# Patient Record
Sex: Female | Born: 1960 | Race: White | Hispanic: No | Marital: Married | Smoking: Never smoker
Health system: Southern US, Community
[De-identification: ages and names within clinical notes are randomized; demographics above are authoritative.]

## PROBLEM LIST (undated history)

## (undated) DIAGNOSIS — N390 Urinary tract infection, site not specified: Secondary | ICD-10-CM

## (undated) DIAGNOSIS — Z8742 Personal history of other diseases of the female genital tract: Secondary | ICD-10-CM

## (undated) HISTORY — DX: Urinary tract infection, site not specified: N39.0

## (undated) HISTORY — PX: NO PAST SURGERIES: SHX2092

---

## 1998-04-04 ENCOUNTER — Inpatient Hospital Stay (HOSPITAL_COMMUNITY): Admission: AD | Admit: 1998-04-04 | Discharge: 1998-04-04 | Payer: Self-pay | Admitting: Obstetrics and Gynecology

## 1998-06-08 ENCOUNTER — Inpatient Hospital Stay (HOSPITAL_COMMUNITY): Admission: AD | Admit: 1998-06-08 | Discharge: 1998-06-10 | Payer: Self-pay | Admitting: Obstetrics and Gynecology

## 1998-06-15 ENCOUNTER — Encounter (HOSPITAL_COMMUNITY): Admission: RE | Admit: 1998-06-15 | Discharge: 1998-09-13 | Payer: Self-pay

## 1998-07-04 ENCOUNTER — Other Ambulatory Visit: Admission: RE | Admit: 1998-07-04 | Discharge: 1998-07-04 | Payer: Self-pay | Admitting: Obstetrics and Gynecology

## 1998-09-22 ENCOUNTER — Encounter (HOSPITAL_COMMUNITY): Admission: RE | Admit: 1998-09-22 | Discharge: 1998-11-11 | Payer: Self-pay | Admitting: *Deleted

## 1999-05-10 ENCOUNTER — Other Ambulatory Visit: Admission: RE | Admit: 1999-05-10 | Discharge: 1999-05-10 | Payer: Self-pay | Admitting: Obstetrics and Gynecology

## 1999-12-13 ENCOUNTER — Inpatient Hospital Stay (HOSPITAL_COMMUNITY): Admission: AD | Admit: 1999-12-13 | Discharge: 1999-12-15 | Payer: Self-pay | Admitting: Obstetrics and Gynecology

## 2000-01-26 ENCOUNTER — Other Ambulatory Visit: Admission: RE | Admit: 2000-01-26 | Discharge: 2000-01-26 | Payer: Self-pay | Admitting: Obstetrics and Gynecology

## 2001-02-05 ENCOUNTER — Other Ambulatory Visit: Admission: RE | Admit: 2001-02-05 | Discharge: 2001-02-05 | Payer: Self-pay | Admitting: Obstetrics and Gynecology

## 2001-05-23 ENCOUNTER — Encounter: Admission: RE | Admit: 2001-05-23 | Discharge: 2001-05-23 | Payer: Self-pay | Admitting: Family Medicine

## 2001-05-23 ENCOUNTER — Encounter: Payer: Self-pay | Admitting: Family Medicine

## 2002-04-21 ENCOUNTER — Other Ambulatory Visit: Admission: RE | Admit: 2002-04-21 | Discharge: 2002-04-21 | Payer: Self-pay | Admitting: Obstetrics and Gynecology

## 2002-08-11 ENCOUNTER — Ambulatory Visit (HOSPITAL_COMMUNITY): Admission: RE | Admit: 2002-08-11 | Discharge: 2002-08-11 | Payer: Self-pay | Admitting: Obstetrics and Gynecology

## 2002-08-11 ENCOUNTER — Encounter (INDEPENDENT_AMBULATORY_CARE_PROVIDER_SITE_OTHER): Payer: Self-pay | Admitting: *Deleted

## 2003-08-31 ENCOUNTER — Other Ambulatory Visit: Admission: RE | Admit: 2003-08-31 | Discharge: 2003-08-31 | Payer: Self-pay | Admitting: Obstetrics and Gynecology

## 2004-09-22 ENCOUNTER — Other Ambulatory Visit: Admission: RE | Admit: 2004-09-22 | Discharge: 2004-09-22 | Payer: Self-pay | Admitting: Obstetrics and Gynecology

## 2005-08-14 ENCOUNTER — Inpatient Hospital Stay (HOSPITAL_COMMUNITY): Admission: RE | Admit: 2005-08-14 | Discharge: 2005-08-16 | Payer: Self-pay | Admitting: Obstetrics and Gynecology

## 2005-08-17 ENCOUNTER — Inpatient Hospital Stay (HOSPITAL_COMMUNITY): Admission: AD | Admit: 2005-08-17 | Discharge: 2005-08-19 | Payer: Self-pay | Admitting: Obstetrics and Gynecology

## 2005-09-26 ENCOUNTER — Other Ambulatory Visit: Admission: RE | Admit: 2005-09-26 | Discharge: 2005-09-26 | Payer: Self-pay | Admitting: Obstetrics and Gynecology

## 2008-02-26 ENCOUNTER — Ambulatory Visit (HOSPITAL_COMMUNITY): Admission: RE | Admit: 2008-02-26 | Discharge: 2008-02-26 | Payer: Self-pay | Admitting: Obstetrics and Gynecology

## 2008-02-26 ENCOUNTER — Encounter (INDEPENDENT_AMBULATORY_CARE_PROVIDER_SITE_OTHER): Payer: Self-pay | Admitting: Obstetrics and Gynecology

## 2011-04-20 NOTE — Discharge Summary (Signed)
Dana Weaver, Dana Weaver                ACCOUNT NO.:  [id number]   MEDICAL RECORD NO.:  [id number]          PATIENT TYPE:  INP   LOCATION:  9154                          FACILITY:  [facility]   PHYSICIAN:  Michelle L. Grewal, M.D.DATE OF BIRTH:  06-Mar-1961   DATE OF ADMISSION:  08/17/2005  DATE OF DISCHARGE:  08/19/2005                                 DISCHARGE SUMMARY   ADMISSION DIAGNOSIS:  Postpartum preeclampsia.   DISCHARGE DIAGNOSIS:  Postpartum preeclampsia.   HOSPITAL COURSE:  The patient is a 50 year old female status post delivery 6  days ago.  She presented with headache and lower back pain.  Blood pressure  was noted to be 170s over 80s to 90s.  She was noted to have 1+ edema on  exam.  Of note, her LFTs were slightly elevated at 69, AST and ALT were 77  and platelet count was 215.  The patient was admitted with a diagnosis of  postpartum PIH and given magnesium and labetalol.  She diuresed tremendously  and approximately 6.5 liters throughout her hospitalization.  She did  develop some mild pulmonary edema and was given Lasix on hospital day #2 and  had a great diuresis and by hospital day #3, was feeling much better.  She  had no shortness of breath.  Her blood pressure was down.  Platelet count on  that day was 253, AST was 56, and ALT was 85.  The magnesium was  discontinued.  She was discharged home on labetalol 100 mg p.o. twice daily.  She was advised about headache, nausea or right upper quadrant pain or  increase in edema.  She will follow up next week for a blood pressure check.      Michelle L. Vincente Poli, M.D.  Electronically Signed     MLG/MEDQ  D:  10/11/2005  T:  10/11/2005  Job:  [id number]

## 2011-04-20 NOTE — Op Note (Signed)
   NAME:  Dana Weaver, Dana Weaver                          ACCOUNT NO.:  [id number]   MEDICAL RECORD NO.:  [id number]                   PATIENT TYPE:  AMB   LOCATION:  SDC                                  FACILITY:  [facility]   PHYSICIAN:  Juluis Mire, M.D.                DATE OF BIRTH:  [birth year]   DATE OF PROCEDURE:  08/10/2002  DATE OF DISCHARGE:  08/11/2002                                 OPERATIVE REPORT   PREOPERATIVE DIAGNOSES:  Abnormal uterine bleeding with known endometrial  polyp.   POSTOPERATIVE DIAGNOSES:  Abnormal uterine bleeding with known endometrial  polyp.   OPERATIVE PROCEDURE:  Paracervical block, cervical dilatation, hysteroscopy  with resection of endometrial polyp, and multiple endometrial biopsies.   SURGEON:  Juluis Mire, M.D.   ANESTHESIA:  Sedation with paracervical block.   ESTIMATED BLOOD LOSS:  Minimal.   PACKS AND DRAINS:  None.   INTRAOPERATIVE BLOOD PLACED:  None.   COMPLICATIONS:  None.   INDICATIONS:  Dictated in history and physical.   PROCEDURE:  The patient taken to the OR and placed in the supine position.  After sedation the patient was placed in the dorsal lithotomy position using  the Allen stirrups.  The patient was draped out for hysteroscopy.  Speculum  was placed in the vaginal vault.  The cervix and vagina cleansed with  Betadine.  The paracervical block was instituted using 1% Xylocaine.  The  cervix was grasped with single tooth tenaculum.  Uterus sounded  approximately 8 cm.  Cervix serially dilated to a size 35 Pratt dilator.  Operative hysteroscope was introduced and intrauterine cavity was distended  using sorbitol.  Apparent multiple polyps were noted and resected.  These  were all sent for pathologic review.  Subsequent multiple endometrial  biopsies were obtained and sent for pathologic review.  There was no  evidence of uterine perforation or complications.  No active bleeding was  noted.  Hysteroscope and single tooth  tenaculum were removed.  Again, no  active bleeding was noted.  Speculum was then removed.  Sponge, instrument,  needle count reported correct by circulating nurse.  The patient did  tolerate procedure well and once alert transferred to recovery room in good  condition.                                               Juluis Mire, M.D.    JSM/MEDQ  D:  08/14/2002  T:  08/14/2002  Job:  [id number]

## 2011-04-20 NOTE — H&P (Signed)
NAME:  Dana Weaver, FOURNIER NO.:  [id number]   MEDICAL RECORD NO.:  [id number]                   PATIENT TYPE:   LOCATION:                                       FACILITY:   PHYSICIAN:  Juluis Mire, M.D.                DATE OF BIRTH:   DATE OF ADMISSION:  DATE OF DISCHARGE:                                HISTORY & PHYSICAL   REASON FOR ADMISSION:  The patient is a 50 year old gravida 4, para 4,  married white female who presents for hysteroscopy.   HISTORY OF PRESENT ILLNESS:  In relation to the present illness, the patient  has noted that her cycles are varying between four to six weeks. They have  become extremely irregular in flow and have increased slightly, although  have not been a significant issue. Because of irregular bleeding, she  underwent a saline infusion ultrasound. On saline infusion ultrasound there  was a significant endometrial polyp extending from the anterior wall. In  view of this finding, the patient now presents for hysteroscopic evaluation  and removed of observed polyp.   ALLERGIES:  No know drug allergies.   CURRENT MEDICATIONS:  Minocin.   PAST MEDICAL HISTORY:  Usual childhood diseases without any significant  sequelae. She has been hospitalized previously with a broken ankle.   PAST SURGICAL HISTORY:  None.   OBSTETRICAL/GYNECOLOGIC HISTORY:  She has had four spontaneous vaginal  deliveries.   FAMILY HISTORY:  Noncontributory.   SOCIAL HISTORY:  Reveals no tobacco or alcohol use.   REVIEW OF SYMPTOMS:  Noncontributory.   PHYSICAL EXAMINATION:  VITAL SIGNS:  The patient is afebrile with stable  vital signs.  HEENT:  The patient is normocephalic. Pupils equal, round and reactive to  light and accommodation. Extraocular movements were intact. Sclerae and  conjunctivae are clear. Oropharynx clear. Neck without thyromegaly,  lymphadenopathy or masses.  LUNGS:  Clear.  CARDIAC:  Regular rhythm and rate without  murmurs, rubs or gallops.  ABDOMEN:  Abdominal exam is benign, no mass, organomegaly or tenderness.  PELVIC:  On pelvic exam, normal external genitalia. The vaginal mucosa is  clear. Cervix is unremarkable. Uterus of normal size, shape and contour.  Adnexa are free of masses or tenderness.  EXTREMITIES:  Trace edema.  NEUROLOGIC:  Exam is grossly within normal limits.   IMPRESSION:  Endometrial polyp.   PLAN:  The patient will undergo hysteroscopic removal of endometrial polyp.  The risks have been discussed including the risk of infection, the risk of  vascular injury that could require hysterectomy or transfusion, the risk of  injury to adjacent organs through perforation that could require exploratory  surgery, the risks of deep venous thrombosis and pulmonary embolus. The  patient expressed understanding of the indications and risks.  Juluis Mire, M.D.    JSM/MEDQ  D:  08/11/2002  T:  08/11/2002  Job:  [id number]

## 2012-02-19 ENCOUNTER — Other Ambulatory Visit: Payer: Self-pay | Admitting: Obstetrics and Gynecology

## 2012-02-19 DIAGNOSIS — R928 Other abnormal and inconclusive findings on diagnostic imaging of breast: Secondary | ICD-10-CM

## 2012-02-28 ENCOUNTER — Ambulatory Visit
Admission: RE | Admit: 2012-02-28 | Discharge: 2012-02-28 | Disposition: A | Discharge status: Home / Self Care | Payer: PRIVATE HEALTH INSURANCE | Source: Ambulatory Visit | Attending: Obstetrics and Gynecology | Admitting: Obstetrics and Gynecology

## 2012-02-28 DIAGNOSIS — R928 Other abnormal and inconclusive findings on diagnostic imaging of breast: Secondary | ICD-10-CM

## 2012-12-19 ENCOUNTER — Other Ambulatory Visit: Payer: Self-pay | Admitting: Obstetrics and Gynecology

## 2012-12-19 DIAGNOSIS — N6310 Unspecified lump in the right breast, unspecified quadrant: Secondary | ICD-10-CM

## 2012-12-30 ENCOUNTER — Ambulatory Visit
Admission: RE | Admit: 2012-12-30 | Discharge: 2012-12-30 | Disposition: A | Discharge status: Home / Self Care | Payer: BC Managed Care – PPO | Source: Ambulatory Visit | Attending: Obstetrics and Gynecology | Admitting: Obstetrics and Gynecology

## 2012-12-30 DIAGNOSIS — N6310 Unspecified lump in the right breast, unspecified quadrant: Secondary | ICD-10-CM

## 2015-03-29 ENCOUNTER — Other Ambulatory Visit: Payer: Self-pay | Admitting: Obstetrics and Gynecology

## 2015-03-30 LAB — CYTOLOGY - PAP

## 2016-12-21 ENCOUNTER — Emergency Department (HOSPITAL_BASED_OUTPATIENT_CLINIC_OR_DEPARTMENT_OTHER): Payer: PRIVATE HEALTH INSURANCE

## 2016-12-21 ENCOUNTER — Emergency Department (HOSPITAL_BASED_OUTPATIENT_CLINIC_OR_DEPARTMENT_OTHER)
Admission: EM | Admit: 2016-12-21 | Discharge: 2016-12-21 | Disposition: A | Discharge status: Home / Self Care | Payer: PRIVATE HEALTH INSURANCE | Attending: Emergency Medicine | Admitting: Emergency Medicine

## 2016-12-21 ENCOUNTER — Encounter (HOSPITAL_BASED_OUTPATIENT_CLINIC_OR_DEPARTMENT_OTHER): Payer: Self-pay | Admitting: Emergency Medicine

## 2016-12-21 DIAGNOSIS — Z79899 Other long term (current) drug therapy: Secondary | ICD-10-CM | POA: Diagnosis not present

## 2016-12-21 DIAGNOSIS — M79605 Pain in left leg: Secondary | ICD-10-CM

## 2016-12-21 DIAGNOSIS — M25562 Pain in left knee: Secondary | ICD-10-CM | POA: Diagnosis present

## 2016-12-21 HISTORY — DX: Personal history of other diseases of the female genital tract: Z87.42

## 2016-12-21 LAB — CBC WITH DIFFERENTIAL/PLATELET
Basophils Absolute: 0 10*3/uL (ref 0.0–0.1)
Basophils Relative: 0 %
Eosinophils Absolute: 0.1 10*3/uL (ref 0.0–0.7)
Eosinophils Relative: 3 %
HCT: 32.4 % — ABNORMAL LOW (ref 36.0–46.0)
Hemoglobin: 9.9 g/dL — ABNORMAL LOW (ref 12.0–15.0)
Lymphocytes Relative: 20 %
Lymphs Abs: 1.1 10*3/uL (ref 0.7–4.0)
MCH: 25.7 pg — ABNORMAL LOW (ref 26.0–34.0)
MCHC: 30.6 g/dL (ref 30.0–36.0)
MCV: 84.2 fL (ref 78.0–100.0)
Monocytes Absolute: 0.5 10*3/uL (ref 0.1–1.0)
Monocytes Relative: 9 %
Neutro Abs: 3.8 10*3/uL (ref 1.7–7.7)
Neutrophils Relative %: 69 %
Platelets: 344 10*3/uL (ref 150–400)
RBC: 3.85 MIL/uL — ABNORMAL LOW (ref 3.87–5.11)
WBC: 5.6 10*3/uL (ref 4.0–10.5)

## 2016-12-21 NOTE — ED Triage Notes (Signed)
Pt placed on birth control due to heavy vaginal bleeding.  Bleeding is controlled but now pt having intermittent pain to left ankle.  Now having left knee and left thigh pain.  Pt concerned about DVT.  No sob today.  Did have sob one week ago but subsided.

## 2016-12-21 NOTE — Discharge Instructions (Signed)
Please read attached information. If you experience any new or worsening signs or symptoms please return to the emergency room for evaluation. Please follow-up with your primary care provider or specialist as discussed. Please use medication prescribed only as directed and discontinue taking if you have any concerning signs or symptoms.   °

## 2016-12-21 NOTE — ED Provider Notes (Signed)
[facility] Provider Note   CSN: [id number] Arrival date & time: 12/21/16  [id number]   History   Chief Complaint Chief Complaint  Patient presents with  . Leg Pain  . Knee Pain    HPI [name] is a 56 y.o. female.  HPI   56 year old female presents today with complaints of left leg and knee pain. Patient reports yesterday she started developing pain behind her left hamstring and posterior knee. She reports that getting in and out of her vehicle causes pain, she denies any pain to palpation, lower extremity swelling or edema, warmth or knee pain. Patient reports that she was shoveling snow yesterday and questions if this may have some part in her discomfort. Patient concerned as she recently started taking birth control for heavy vaginal bleeding.      Past Medical History:  Diagnosis Date  . History of heavy vaginal bleeding     There are no active problems to display for this patient.   No past surgical history on file.  OB History    No data available       Home Medications    Prior to Admission medications   Medication Sig Start Date End Date Taking? Authorizing Provider  ferrous sulfate 325 (65 FE) MG EC tablet Take 325 mg by mouth 2 (two) times daily.   Yes Historical Provider, MD  UNKNOWN TO PATIENT    Yes Historical Provider, MD    Family History No family history on file.  Social History Social History  Substance Use Topics  . Smoking status: Never Smoker  . Smokeless tobacco: Never Used  . Alcohol use Not on file     Allergies   Patient has no known allergies.   Review of Systems Review of Systems  All other systems reviewed and are negative.  Physical Exam Updated Vital Signs BP 130/80   Pulse 98   Temp 98.5 F (36.9 C) (Oral)   Resp 16   Ht 5\' 4"  (1.626 m)   Wt 68 kg   LMP 12/01/2016   SpO2 99%   BMI 25.75 kg/m   Physical Exam  Constitutional: She is oriented to person, place, and time. She appears  well-developed and well-nourished.  HENT:  Head: Normocephalic and atraumatic.  Eyes: Conjunctivae are normal. Pupils are equal, round, and reactive to light. Right eye exhibits no discharge. Left eye exhibits no discharge. No scleral icterus.  Neck: Normal range of motion. No JVD present. No tracheal deviation present.  Pulmonary/Chest: Effort normal. No stridor.  Musculoskeletal:  Lower extremities equal bilateral no swelling or edema. No significant tenderness, no warmth to touch. Pedal pulses 2+.  Neurological: She is alert and oriented to person, place, and time. Coordination normal.  Psychiatric: She has a normal mood and affect. Her behavior is normal. Judgment and thought content normal.  Nursing note and vitals reviewed.    ED Treatments / Results  Labs (all labs ordered are listed, but only abnormal results are displayed) Labs Reviewed  CBC WITH DIFFERENTIAL/PLATELET - Abnormal; Notable for the following:       Result Value   RBC 3.85 (*)    Hemoglobin 9.9 (*)    HCT 32.4 (*)    MCH 25.7 (*)    All other components within normal limits    EKG  EKG Interpretation None       Radiology [place] Venous Img Lower Unilateral Left  Result Date: 12/21/2016 CLINICAL DATA:  Left knee pain. EXAM: LEFT LOWER  EXTREMITY VENOUS DOPPLER ULTRASOUND TECHNIQUE: Gray-scale sonography with graded compression, as well as color Doppler and duplex ultrasound were performed to evaluate the lower extremity deep venous systems from the level of the common femoral vein and including the common femoral, femoral, profunda femoral, popliteal and calf veins including the posterior tibial, peroneal and gastrocnemius veins when visible. The superficial great saphenous vein was also interrogated. Spectral Doppler was utilized to evaluate flow at rest and with distal augmentation maneuvers in the common femoral, femoral and popliteal veins. COMPARISON:  None. FINDINGS: Contralateral Common Femoral Vein:  Respiratory phasicity is normal and symmetric with the symptomatic side. No evidence of thrombus. Normal compressibility. Common Femoral Vein: No evidence of thrombus. Normal compressibility, respiratory phasicity and response to augmentation. Saphenofemoral Junction: No evidence of thrombus. Normal compressibility and flow on color Doppler imaging. Profunda Femoral Vein: No evidence of thrombus. Normal compressibility and flow on color Doppler imaging. Femoral Vein: No evidence of thrombus. Normal compressibility, respiratory phasicity and response to augmentation. Popliteal Vein: No evidence of thrombus. Normal compressibility, respiratory phasicity and response to augmentation. Calf Veins: No evidence of thrombus. Normal compressibility and flow on color Doppler imaging. Superficial Great Saphenous Vein: No evidence of thrombus. Normal compressibility and flow on color Doppler imaging. Venous Reflux:  None. Other Findings: No evidence of superficial thrombophlebitis or abnormal fluid collection. IMPRESSION: No evidence of left lower extremity deep venous thrombosis. Electronically Signed   By: Irish Lack M.D.   On: 12/21/2016 10:38    Procedures Procedures (including critical care time)  Medications Ordered in ED Medications - No data to display   Initial Impression / Assessment and Plan / ED Course  I have reviewed the triage vital signs and the nursing notes.  Pertinent labs & imaging results that were available during my care of the patient were reviewed by me and considered in my medical decision making (see chart for details).     Labs:  Imaging:  Consults:  Therapeutics:  Discharge Meds:   Assessment/Plan:  56 year old female presents today with leg pain. Patient's presentation is most consistent with muscular pain. She was shoveling snow yesterday. She has no signs of DVT on exam. Patient will be discharged home with symptomatic care instructions, she was given strict return  precautions. She verbalized understanding and agreement to today's plan had no further questions or concerns at time of discharge    Final Clinical Impressions(s) / ED Diagnoses   Final diagnoses:  Pain of left lower extremity    New Prescriptions Discharge Medication List as of 12/21/2016 11:12 AM       Eyvonne Mechanic, PA-C 12/21/16 1545    Geoffery Lyons, MD 12/21/16 1600

## 2017-04-22 ENCOUNTER — Encounter (INDEPENDENT_AMBULATORY_CARE_PROVIDER_SITE_OTHER): Payer: Self-pay

## 2017-04-22 ENCOUNTER — Ambulatory Visit (INDEPENDENT_AMBULATORY_CARE_PROVIDER_SITE_OTHER): Payer: PRIVATE HEALTH INSURANCE | Admitting: Neurology

## 2017-04-22 ENCOUNTER — Encounter: Payer: Self-pay | Admitting: Neurology

## 2017-04-22 VITALS — BP 116/77 | HR 88 | Ht 64.0 in | Wt 154.4 lb

## 2017-04-22 DIAGNOSIS — R253 Fasciculation: Secondary | ICD-10-CM

## 2017-04-22 NOTE — Progress Notes (Signed)
[facility]    Provider:  Dr Jaynee Eagles Referring Provider: Ronald Lobo MD Primary Care Physician:  Ronald Lobo MD  CC:  Fasciculations  HPI:  Dana Weaver is a 56 y.o. female here as a referral from Dr. Laurann Montana for fasciculations. Started 1-2 years ago. She would feel them at night in her legs or in her thigh or back or arms, really anywhere. She weight lifts, she thought it was the exercise Mostly at night. Worsening the last 3-4 months and happens more during the day, anywhere on the torso, extremities and even on the face occ. A muscle twitch is brief, can be multiple in a row. No Fhx of neuromuscular disease. No weakness. She is still lifting weights.No rashes or joint pain. She has tingling in her toes or pin prick feelings rarely maybe once a week and is brief. No consistent sensory changes or numbness. Strength is fine. No weight loss or muscle wasting. No difficulty with speech, no dysarthria, dysphagia, facial weakness. Not painful but she is worried because of the dramatic increase. She has increased stress recently that coincides with the increase of fasciculations. Her son is running for political office and this is stressful for her. She takes Vitamin D daily due to deficiency. She also takes a multivitamin.  No other OTC meds, she takes motrin sometimes and no benadryl or anti-histamines or anti-cholinergic. No inhalers.   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 No associated loss of strength or coordination. No focal neurologic deficits. Completely non-incapacitating and likely benign but need to rule out other etiologies.   Review of Systems: Patient complains of symptoms per HPI as well as the following symptoms: twitching, no CP, no SOB, +stress. Pertinent negatives per HPI. All others negative.   Social History    Social History  . Marital status: Married    Spouse name: N/A  . Number of children: 5  . Years of education: BA (accounting)   Occupational History  . [employer]    Social History Main Topics  . Smoking status: Never Smoker  . Smokeless tobacco: Never Used  . Alcohol use 1.8 oz/week    3 Standard drinks or equivalent per week  . Drug use: No  . Sexual activity: Not on file   Other Topics Concern  . Not on file   Social History Narrative   Lives at home w/ her husband and 3 children   Right-handed   Caffeine: 3 cups per day    Family History  Problem Relation Age of Onset  . Heart disease Mother   . Stroke Mother   . Alzheimer's disease Mother   . Breast cancer Mother   . AAA (abdominal aortic aneurysm) Father   . High blood pressure Father   . Colon cancer Paternal Uncle   . Neuromuscular disorder Neg Hx     Past Medical History:  Diagnosis Date  . History of heavy vaginal bleeding   . UTI (urinary tract infection)     Past Surgical History:  Procedure Laterality Date  . NO PAST SURGERIES      Current Outpatient Prescriptions  Medication Sig Dispense Refill  . cholecalciferol (VITAMIN D) 1000 units tablet Take 1,000 Units by mouth daily.    . Multiple Vitamin (MULTIVITAMIN) tablet Take 1 tablet by mouth daily.     No current facility-administered medications for this  visit.     Allergies as of 04/22/2017  . (No Known Allergies)    Vitals: BP 116/77   Pulse 88   Ht _0  (1.626 m)   Wt 154 lb 6.4 oz (70 kg)   BMI 26.50 kg/m  Last Weight:  Wt Readings from Last 1 Encounters:  04/22/17 154 lb 6.4 oz (70 kg)   Last Height:   Ht Readings from Last 1 Encounters:  04/22/17 _1  (1.626 m)   Physical exam: Exam: Gen: NAD, conversant, well nourised, well groomed                     CV: RRR, no MRG. No Carotid Bruits. No peripheral edema, warm, nontender Eyes: Conjunctivae clear without exudates or  hemorrhage  Neuro: Detailed Neurologic Exam  Speech:    Speech is normal; fluent and spontaneous with normal comprehension.  Cognition:    The patient is oriented to person, place, and time;     recent and remote memory intact;     language fluent;     normal attention, concentration,     fund of knowledge Cranial Nerves:    The pupils are equal, round, and reactive to light. The fundi are normal and spontaneous venous pulsations are present. Visual fields are full to finger confrontation. Extraocular movements are intact. Trigeminal sensation is intact and the muscles of mastication are normal. The face is symmetric. The palate elevates in the midline. Hearing intact. Voice is normal. Shoulder shrug is normal. The tongue has normal motion without fasciculations.   Coordination:    Normal finger to nose and heel to shin. Normal rapid alternating movements.   Gait:    Heel-toe and tandem gait are normal.   Motor Observation:    No asymmetry, no atrophy, and no involuntary movements noted. Tone:    Normal muscle tone.    Posture:    Posture is normal. normal erect    Strength:    Strength is V/V in the upper and lower limbs.      Sensation: intact to LT     Reflex Exam:  DTR's:    Deep tendon reflexes in the upper and lower extremities are normal bilaterally.   Toes:    The toes are downgoing bilaterally.   Clonus:    Clonus is absent.       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 Neuro exam normal.   Labs Emg/ncs one arm and one leg  Consider MRI of the brain and cervical spine if clinically warranted after the above  Cc: Ronald Lobo MD  Orders Placed This Encounter  Procedures  . CK  . Comprehensive metabolic panel  . Magnesium  . Phosphorus  . Heavy  metals, blood  . TSH  . Sedimentation rate  . ANA w/Reflex  . Rheumatoid factor  . B12 and Folate Panel  . Methylmalonic acid, serum  . Multiple Myeloma Panel (SPEP&IFE w/QIG)  . B. burgdorfi Antibody  . PTH, Intact and Calcium  . NCV with EMG(electromyography)    Sarina Ill, MD  [facility] [address] [place], [place] [address]  Phone [phone] Fax [phone]

## 2017-04-22 NOTE — Patient Instructions (Addendum)
Remember to drink plenty of fluid, eat healthy meals and do not skip any meals. Try to eat protein with a every meal and eat a healthy snack such as fruit or nuts in between meals. Try to keep a regular sleep-wake schedule and try to exercise daily, particularly in the form of walking, 20-30 minutes a day, if you can.   As far as diagnostic testing: Labs, emg/ncs  I would like to see you back for emg/ncs, sooner if we need to. Please call us with any interim questions, concerns, problems, updates or refill requests.   Our phone number is [phone]. We also have an after hours call service for urgent matters and there is a physician on-call for urgent questions. For any emergencies you know to call 911 or go to the nearest emergency room   

## 2017-04-25 LAB — B12 AND FOLATE PANEL
Folate: 19.5 ng/mL (ref >3.0)
Vitamin B-12: 391 pg/mL (ref 232–1245)

## 2017-04-25 LAB — HEAVY METALS, BLOOD
Arsenic: 7 ug/L (ref 2–23)
Lead, Blood: NOT DETECTED ug/dL (ref 0–19)
Mercury: 1.5 ug/L (ref 0.0–14.9)

## 2017-04-25 LAB — COMPREHENSIVE METABOLIC PANEL
A/G RATIO: 1.8 (ref 1.2–2.2)
ALBUMIN: 4.5 g/dL (ref 3.5–5.5)
ALK PHOS: 56 IU/L (ref 39–117)
ALT: 12 IU/L (ref 0–32)
AST: 13 IU/L (ref 0–40)
BILIRUBIN TOTAL: 0.3 mg/dL (ref 0.0–1.2)
BUN / CREAT RATIO: 14 (ref 9–23)
BUN: 10 mg/dL (ref 6–24)
CHLORIDE: 105 mmol/L (ref 96–106)
CO2: 28 mmol/L (ref 18–29)
Calcium: 10 mg/dL (ref 8.7–10.2)
Creatinine, Ser: 0.7 mg/dL (ref 0.57–1.00)
GFR calc Af Amer: 114 mL/min/{1.73_m2} (ref >59)
GFR calc non Af Amer: 99 mL/min/{1.73_m2} (ref >59)
GLOBULIN, TOTAL: 2.5 g/dL (ref 1.5–4.5)
Glucose: 97 mg/dL (ref 65–99)
Potassium: 5.5 mmol/L — ABNORMAL HIGH (ref 3.5–5.2)
SODIUM: 145 mmol/L — AB (ref 134–144)
Total Protein: 7 g/dL (ref 6.0–8.5)

## 2017-04-25 LAB — MULTIPLE MYELOMA PANEL, SERUM
ALBUMIN SERPL ELPH-MCNC: 4.1 g/dL (ref 2.9–4.4)
ALBUMIN/GLOB SERPL: 1.5 (ref 0.7–1.7)
ALPHA2 GLOB SERPL ELPH-MCNC: 0.7 g/dL (ref 0.4–1.0)
Alpha 1: 0.2 g/dL (ref 0.0–0.4)
B-Globulin SerPl Elph-Mcnc: 1.1 g/dL (ref 0.7–1.3)
Gamma Glob SerPl Elph-Mcnc: 0.8 g/dL (ref 0.4–1.8)
Globulin, Total: 2.9 g/dL (ref 2.2–3.9)
IGA/IMMUNOGLOBULIN A, SERUM: 139 mg/dL (ref 87–352)
IGM (IMMUNOGLOBULIN M), SRM: 135 mg/dL (ref 26–217)
IgG (Immunoglobin G), Serum: 751 mg/dL (ref 700–1600)

## 2017-04-25 LAB — SEDIMENTATION RATE: Sed Rate: 6 mm/hr (ref 0–40)

## 2017-04-25 LAB — RHEUMATOID FACTOR

## 2017-04-25 LAB — PTH, INTACT AND CALCIUM: PTH: 23 pg/mL (ref 15–65)

## 2017-04-25 LAB — TSH: TSH: 1.74 u[IU]/mL (ref 0.450–4.500)

## 2017-04-25 LAB — MAGNESIUM: MAGNESIUM: 2.2 mg/dL (ref 1.6–2.3)

## 2017-04-25 LAB — B. BURGDORFI ANTIBODIES

## 2017-04-25 LAB — METHYLMALONIC ACID, SERUM: Methylmalonic Acid: 92 nmol/L (ref 0–378)

## 2017-04-25 LAB — CK: Total CK: 127 U/L (ref 24–173)

## 2017-04-25 LAB — ANA W/REFLEX: ANA: NEGATIVE

## 2017-04-25 LAB — PHOSPHORUS: PHOSPHORUS: 2.1 mg/dL — AB (ref 2.5–4.5)

## 2017-05-02 ENCOUNTER — Telehealth: Payer: Self-pay | Admitting: *Deleted

## 2017-05-02 NOTE — Telephone Encounter (Signed)
Spoke with patient and informed her that her labs are all normal except her Phosphorus is just a little low.. Advised her that Dr Lucia GaskinsAhern recommends increasing her foods rich in phosphorus. Phosphorus is naturally found in protein-rich foods such as meats, poultry, fish, nuts, beans and dairy products. Advised her that  Phosphorus found in animal foods is absorbed more easily than phosphorus found in plant foods. Low phosphorus can cause fasciculations and muscle cramps and weakness.  Patient inquired if her Hgb or Vit D was checked; advised her those were not checked. She asked for normal level of phosphorus; advised her that normal is  2.5-4.5, and her result was 2.1. She verbalized understanding.

## 2017-05-21 ENCOUNTER — Telehealth: Payer: Self-pay | Admitting: Neurology

## 2017-05-21 NOTE — Telephone Encounter (Signed)
Patient called office confirming NCV/EMG for tomorrow and requested to speak with RN Victorino DikeJennifer about the appointment asked patient if I was able to help her in anyway stated no she would like to speak with RN.  Please call

## 2017-05-21 NOTE — Telephone Encounter (Signed)
Returned pt call. Voices concern that she plans on travelling out of town next weekend. Assured her that neither the NCS nor EMG should lead to any complications although she may experience some soreness for a day or two where the needles were inserted during EMG. Also let he know that it was fine to eat a light breakfast before the procedure. She verbalized understanding and appreciation for call.

## 2017-05-22 ENCOUNTER — Ambulatory Visit (INDEPENDENT_AMBULATORY_CARE_PROVIDER_SITE_OTHER): Payer: PRIVATE HEALTH INSURANCE | Admitting: Neurology

## 2017-05-22 ENCOUNTER — Ambulatory Visit (INDEPENDENT_AMBULATORY_CARE_PROVIDER_SITE_OTHER): Payer: Self-pay | Admitting: Neurology

## 2017-05-22 DIAGNOSIS — R253 Fasciculation: Secondary | ICD-10-CM | POA: Diagnosis not present

## 2017-05-22 DIAGNOSIS — Z0289 Encounter for other administrative examinations: Secondary | ICD-10-CM

## 2017-05-22 NOTE — Progress Notes (Signed)
Full Name: Dana MurphyMary Weaver Gender: Female MRN #: [id number] Date of Birth: 12-07-1960    Visit Date: 05/22/2017 09:43 Age: 56 Years 0 Months Old Examining Physician: Naomie DeanAntonia Vernessa Likes, MD  Referring Physician: Matthias HughsBuccini, MD  History: Lovely 56 year old female with fasciculations  Summary: EMG nerve conduction studies were performed on the right upper and lower extremities. All nerves and muscles (as detailed in the following tables) were normal.    Conclusion: This is a normal study. 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  Cc: Dr. Theodosia BlenderBuccini  Deeanna Beightol, M.D.  [facility] [address] [place], [place] [address] Tel: [phone] Fax: [phone]        [facility]    Nerve / Sites Rec. Site Latency Ref. Amplitude Ref. Rel Amp Segments Distance Velocity Ref. Area    ms ms mV mV %  cm m/s m/s mVms  R Median - APB     Wrist APB 3.3 ?4.4 7.9 ?4.0 100 Wrist - APB 7   22.6     Upper arm APB 7.2  8.2  104 Upper arm - Wrist   ?49 24.4  R Ulnar - ADM     Wrist ADM 2.6 ?3.3 12.3 ?6.0 100 Wrist - ADM 7   30.0     B.Elbow ADM 5.7  10.0  81.4 B.Elbow - Wrist 19 60 ?49 25.2     A.Elbow ADM 7.4  10.4  104 A.Elbow - B.Elbow 10 58 ?49 25.5         A.Elbow - Wrist      R Peroneal - EDB     Ankle EDB 4.2 ?6.5 6.0 ?2.0 100 Ankle - EDB 9   19.7     Fib head EDB 10.5  7.5  125 Fib head - Ankle 31 49 ?44 24.5     Pop fossa EDB 12.6  7.4  99.4 Pop fossa - Fib head 10 48 ?44 25.0     Acc Peron EDB 4.4  2.1  28.6 Pop fossa - Ankle    6.8         Acc Peron - Pop fossa      R Tibial - AH     Ankle AH 5.0 ?5.8 19.6 ?4.0 100 Ankle - AH 9   54.1     Pop fossa AH 12.1  14.3  73.2 Pop fossa - Ankle 36 51 ?41 48.1             SNC    Nerve / Sites Rec. Site Peak Lat Ref.  Amp Ref. Segments Distance    ms ms V V  cm  R Sural - Ankle (Calf)     Calf Ankle 3.18 ?4.40 30 ?6 Calf - Ankle 14  R Superficial peroneal - Ankle     Lat leg Ankle 3.80 ?4.40 12 ?6 Lat leg - Ankle 14  R Median -  Orthodromic (Dig II, Mid palm)     Dig II Wrist 2.60 ?3.40 27 ?10 Dig II - Wrist 13  R Ulnar - Orthodromic, (Dig V, Mid palm)     Dig V Wrist 2.50 ?3.10 11 ?5 Dig V - Wrist 3011             F  Wave    Nerve F Lat Ref.   ms ms  R Ulnar - ADM 25.5 ?32.0  R Tibial - AH 46.7 ?56.0         EMG full  EMG Summary Table    Spontaneous MUAP Recruitment  Muscle IA Fib PSW Fasc Other Amp Dur. Poly Pattern  R. Deltoid Normal None None None _______ Normal Normal Normal Normal  R. Triceps brachii Normal None None None _______ Normal Normal Normal Normal  R. Pronator teres Normal None None None _______ Normal Normal Normal Normal  R. Biceps brachii Normal None None None _______ Normal Normal Normal Normal  R. First dorsal interosseous Normal None None None _______ Normal Normal Normal Normal  R. Iliopsoas Normal None None None _______ Normal Normal Normal Normal  R. Vastus medialis Normal None None None _______ Normal Normal Normal Normal  R. Tibialis anterior Normal None None None _______ Normal Normal Normal Normal  R. Gastrocnemius (Medial head) Normal None None None _______ Normal Normal Normal Normal  R. Extensor hallucis longus Normal None None None _______ Normal Normal Normal Normal

## 2017-05-22 NOTE — Progress Notes (Signed)
See procedure note.

## 2017-05-23 DIAGNOSIS — R253 Fasciculation: Secondary | ICD-10-CM | POA: Insufficient documentation

## 2017-05-23 NOTE — Procedures (Signed)
Full Name: Dana Weaver Gender: Female MRN #: [id number] Date of Birth: 12-07-1960    Visit Date: 05/22/2017 09:43 Age: [age] Years 0 Months Old Examining Physician: Naomie DeanAntonia Sinda Leedom, MD  Referring Physician: Matthias HughsBuccini, MD  History: Lovely 56 year old female with fasciculations  Summary: EMG nerve conduction studies were performed on the right upper and lower extremities. All nerves and muscles (as detailed in the following tables) were normal.    Conclusion: This is a normal study. 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  Cc: Dr. Theodosia BlenderBuccini  Maat Kafer, M.D.  [facility] [address] [place], [place] [address] Tel: [phone] Fax: [phone]        [facility]    Nerve / Sites Rec. Site Latency Ref. Amplitude Ref. Rel Amp Segments Distance Velocity Ref. Area    ms ms mV mV %  cm m/s m/s mVms  R Median - APB     Wrist APB 3.3 ?4.4 7.9 ?4.0 100 Wrist - APB 7   22.6     Upper arm APB 7.2  8.2  104 Upper arm - Wrist   ?49 24.4  R Ulnar - ADM     Wrist ADM 2.6 ?3.3 12.3 ?6.0 100 Wrist - ADM 7   30.0     B.Elbow ADM 5.7  10.0  81.4 B.Elbow - Wrist 19 60 ?49 25.2     A.Elbow ADM 7.4  10.4  104 A.Elbow - B.Elbow 10 58 ?49 25.5         A.Elbow - Wrist      R Peroneal - EDB     Ankle EDB 4.2 ?6.5 6.0 ?2.0 100 Ankle - EDB 9   19.7     Fib head EDB 10.5  7.5  125 Fib head - Ankle 31 49 ?44 24.5     Pop fossa EDB 12.6  7.4  99.4 Pop fossa - Fib head 10 48 ?44 25.0     Acc Peron EDB 4.4  2.1  28.6 Pop fossa - Ankle    6.8         Acc Peron - Pop fossa      R Tibial - AH     Ankle AH 5.0 ?5.8 19.6 ?4.0 100 Ankle - AH 9   54.1     Pop fossa AH 12.1  14.3  73.2 Pop fossa - Ankle 36 51 ?41 48.1             SNC    Nerve / Sites Rec. Site Peak Lat Ref.  Amp Ref. Segments Distance    ms ms V V  cm  R Sural - Ankle (Calf)     Calf Ankle 3.18 ?4.40 30 ?6 Calf - Ankle 14  R Superficial peroneal - Ankle     Lat leg Ankle 3.80 ?4.40 12 ?6 Lat leg - Ankle 14  R Median -  Orthodromic (Dig II, Mid palm)     Dig II Wrist 2.60 ?3.40 27 ?10 Dig II - Wrist 13  R Ulnar - Orthodromic, (Dig V, Mid palm)     Dig V Wrist 2.50 ?3.10 11 ?5 Dig V - Wrist 3011             F  Wave    Nerve F Lat Ref.   ms ms  R Ulnar - ADM 25.5 ?32.0  R Tibial - AH 46.7 ?56.0         EMG full  EMG Summary Table    Spontaneous MUAP Recruitment  Muscle IA Fib PSW Fasc Other Amp Dur. Poly Pattern  R. Deltoid Normal None None None _______ Normal Normal Normal Normal  R. Triceps brachii Normal None None None _______ Normal Normal Normal Normal  R. Pronator teres Normal None None None _______ Normal Normal Normal Normal  R. Biceps brachii Normal None None None _______ Normal Normal Normal Normal  R. First dorsal interosseous Normal None None None _______ Normal Normal Normal Normal  R. Iliopsoas Normal None None None _______ Normal Normal Normal Normal  R. Vastus medialis Normal None None None _______ Normal Normal Normal Normal  R. Tibialis anterior Normal None None None _______ Normal Normal Normal Normal  R. Gastrocnemius (Medial head) Normal None None None _______ Normal Normal Normal Normal  R. Extensor hallucis longus Normal None None None _______ Normal Normal Normal Normal

## 2017-09-09 ENCOUNTER — Other Ambulatory Visit: Payer: Self-pay | Admitting: Obstetrics and Gynecology

## 2017-09-09 DIAGNOSIS — R928 Other abnormal and inconclusive findings on diagnostic imaging of breast: Secondary | ICD-10-CM

## 2017-09-12 ENCOUNTER — Ambulatory Visit
Admission: RE | Admit: 2017-09-12 | Discharge: 2017-09-12 | Disposition: A | Discharge status: Home / Self Care | Payer: PRIVATE HEALTH INSURANCE | Source: Ambulatory Visit | Attending: Obstetrics and Gynecology | Admitting: Obstetrics and Gynecology

## 2017-09-12 DIAGNOSIS — R928 Other abnormal and inconclusive findings on diagnostic imaging of breast: Secondary | ICD-10-CM

## 2017-09-13 ENCOUNTER — Other Ambulatory Visit: Payer: Self-pay | Admitting: Obstetrics and Gynecology

## 2017-09-13 DIAGNOSIS — Z803 Family history of malignant neoplasm of breast: Secondary | ICD-10-CM

## 2018-09-17 ENCOUNTER — Other Ambulatory Visit: Payer: Self-pay | Admitting: Obstetrics and Gynecology

## 2018-09-17 DIAGNOSIS — Z803 Family history of malignant neoplasm of breast: Secondary | ICD-10-CM

## 2019-09-22 ENCOUNTER — Other Ambulatory Visit: Payer: Self-pay | Admitting: Obstetrics and Gynecology

## 2019-09-22 DIAGNOSIS — Z803 Family history of malignant neoplasm of breast: Secondary | ICD-10-CM

## 2019-11-02 ENCOUNTER — Ambulatory Visit
Admission: RE | Admit: 2019-11-02 | Discharge: 2019-11-02 | Disposition: A | Discharge status: Home / Self Care | Payer: PRIVATE HEALTH INSURANCE | Source: Ambulatory Visit | Attending: Obstetrics and Gynecology | Admitting: Obstetrics and Gynecology

## 2019-11-02 DIAGNOSIS — Z803 Family history of malignant neoplasm of breast: Secondary | ICD-10-CM

## 2019-11-02 MED ORDER — GADOBUTROL 1 MMOL/ML IV SOLN
7.0000 mL | Freq: Once | INTRAVENOUS | Status: AC | PRN
  Administered 2019-11-02: 13:00:00 7 mL via INTRAVENOUS

## 2019-11-03 ENCOUNTER — Other Ambulatory Visit: Payer: Self-pay | Admitting: Obstetrics and Gynecology

## 2019-11-03 DIAGNOSIS — R9389 Abnormal findings on diagnostic imaging of other specified body structures: Secondary | ICD-10-CM

## 2019-11-12 ENCOUNTER — Ambulatory Visit
Admission: RE | Admit: 2019-11-12 | Discharge: 2019-11-12 | Disposition: A | Discharge status: Home / Self Care | Payer: PRIVATE HEALTH INSURANCE | Source: Ambulatory Visit | Attending: Obstetrics and Gynecology | Admitting: Obstetrics and Gynecology

## 2019-11-12 ENCOUNTER — Other Ambulatory Visit: Payer: Self-pay

## 2019-11-12 ENCOUNTER — Other Ambulatory Visit (HOSPITAL_COMMUNITY): Payer: Self-pay | Admitting: Diagnostic Radiology

## 2019-11-12 DIAGNOSIS — R9389 Abnormal findings on diagnostic imaging of other specified body structures: Secondary | ICD-10-CM

## 2019-11-12 MED ORDER — GADOBUTROL 1 MMOL/ML IV SOLN: 7.0000 mL | Freq: Once | INTRAVENOUS | Status: DC | PRN

## 2019-12-07 ENCOUNTER — Other Ambulatory Visit: Payer: PRIVATE HEALTH INSURANCE

## 2020-01-21 IMAGING — MG MM BREAST LOCALIZATION CLIP
6 of 10 series · 6 of 30 positions shown · non-contrast
Comparison: Previous exam(s).

CLINICAL DATA: 58-year-old female for evaluation of biopsy clips
following MR guided RIGHT breast biopsy.

EXAM:
DIAGNOSTIC RIGHT MAMMOGRAM POST MRI BIOPSY

[R LM synth-2D (1 of 2)]
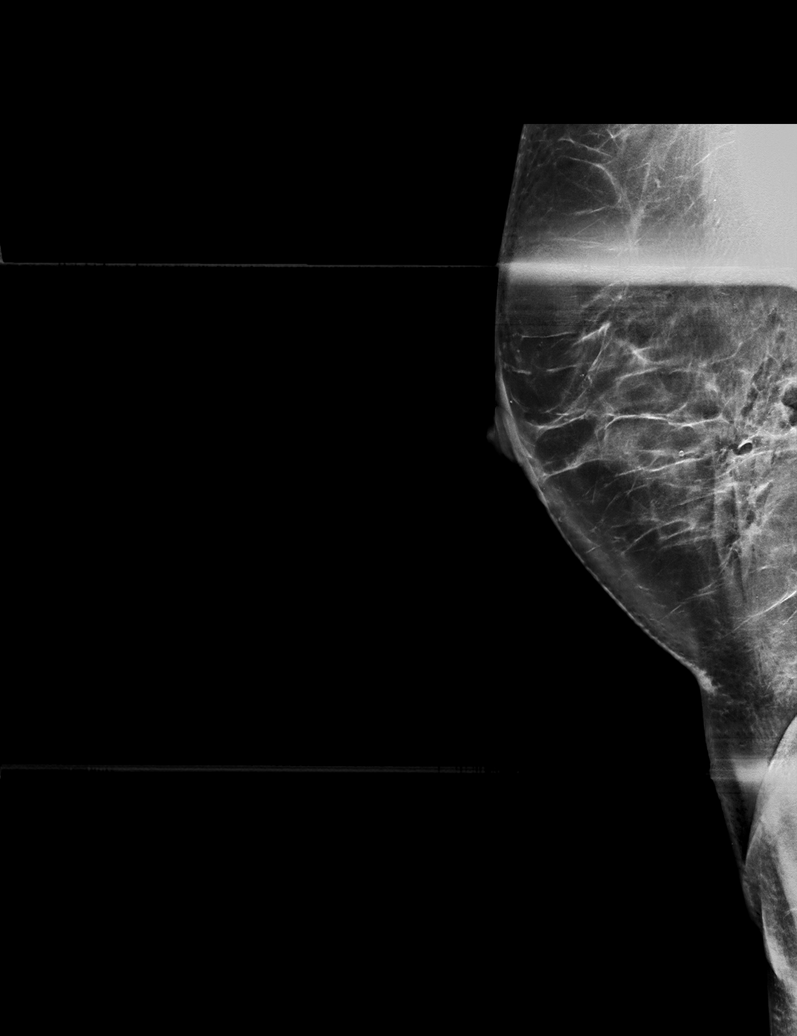

[R CV synth-2D]
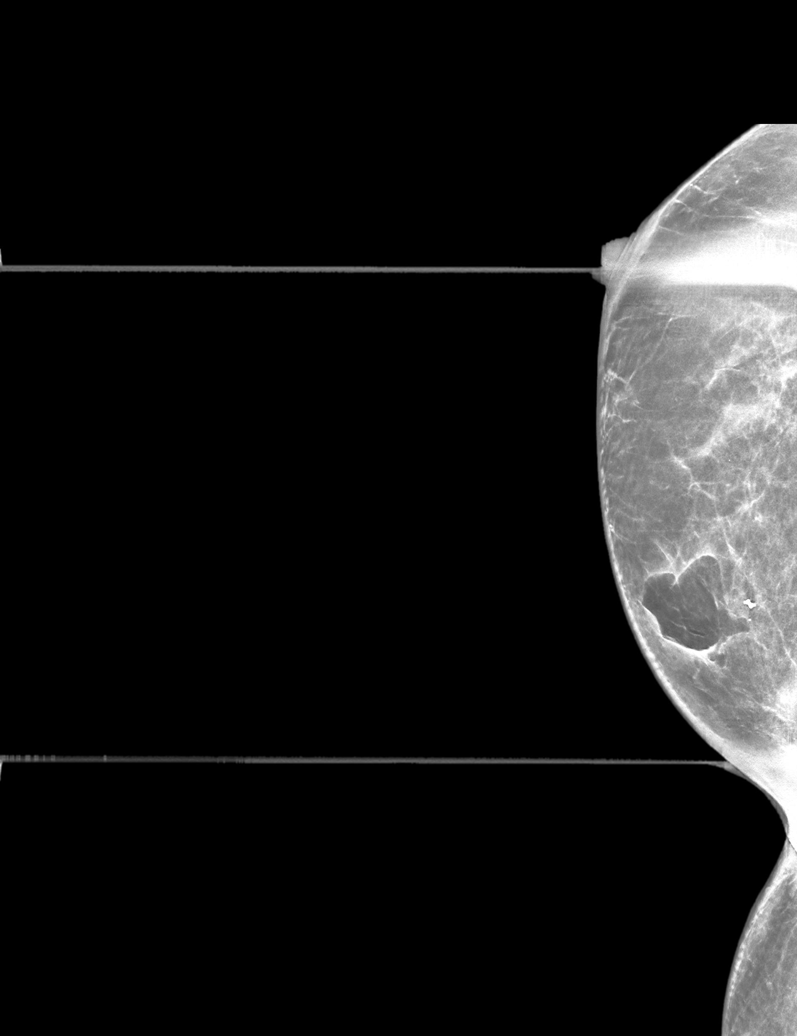

[R CC synth-2D]
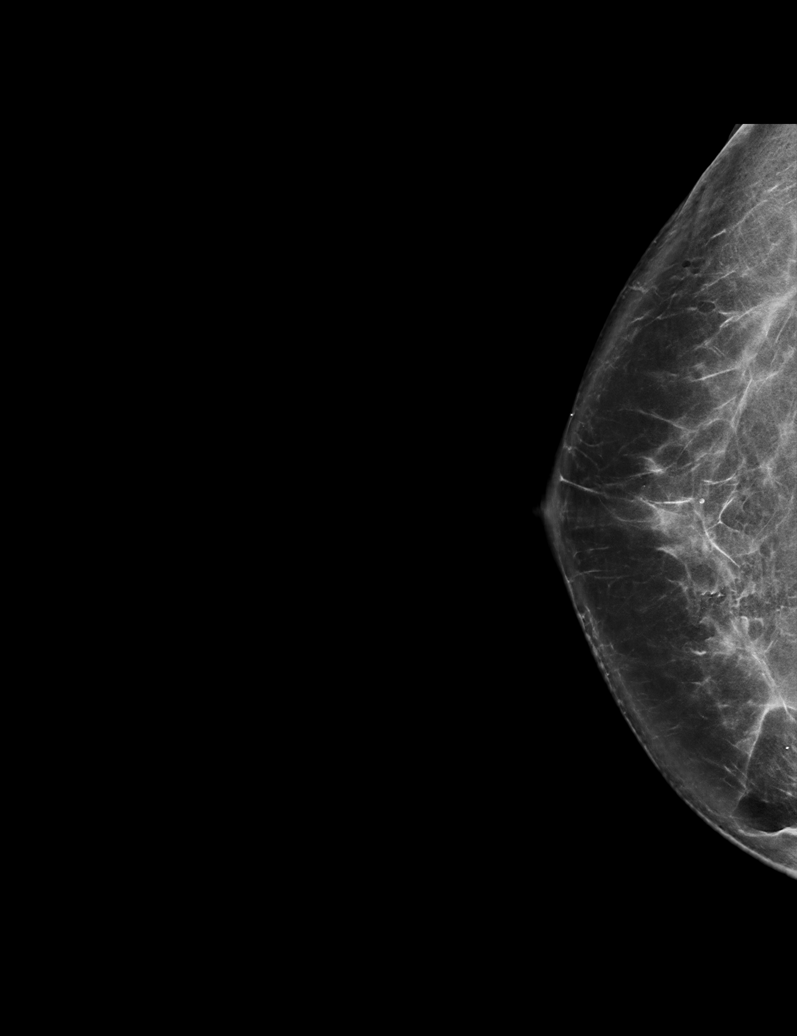

[R XCCL synth-2D]
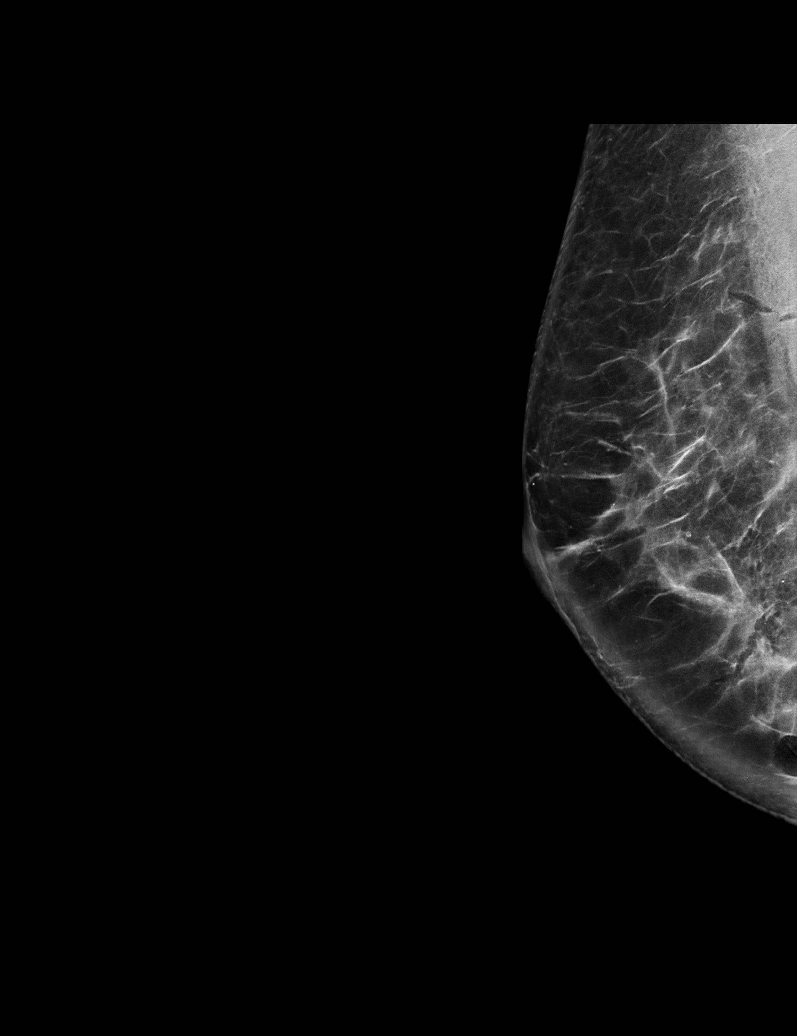

[R LM synth-2D (2 of 2)]
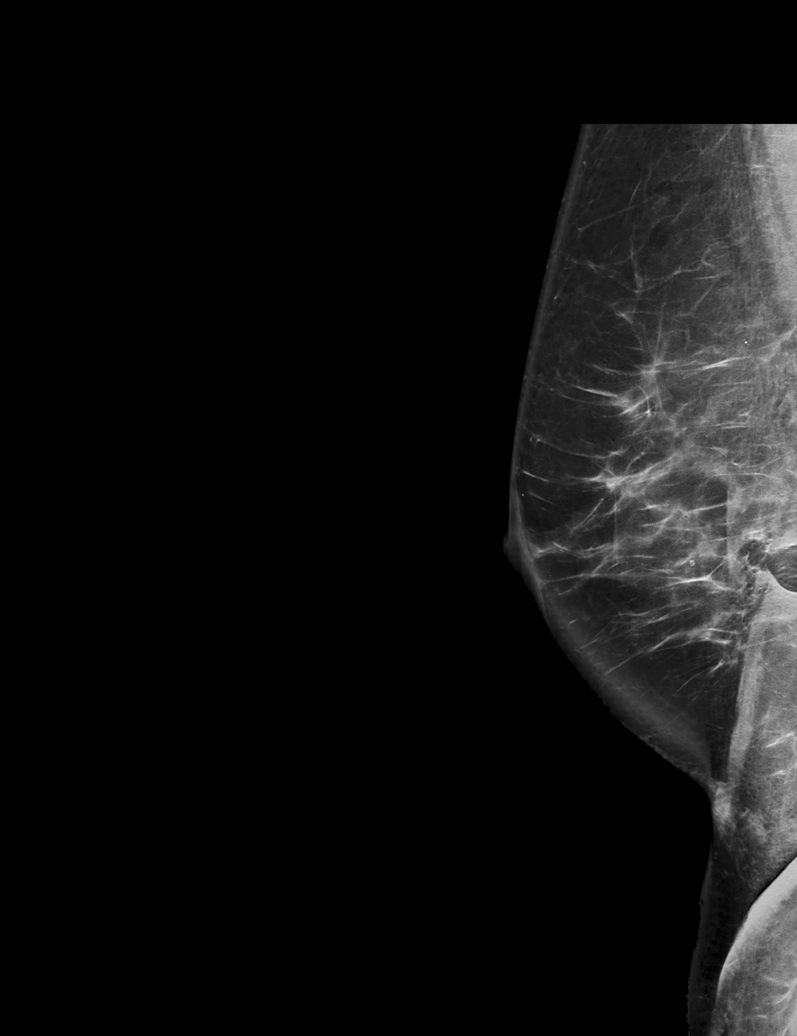

[R XCCL tomo · tomo slice 40/79.0]
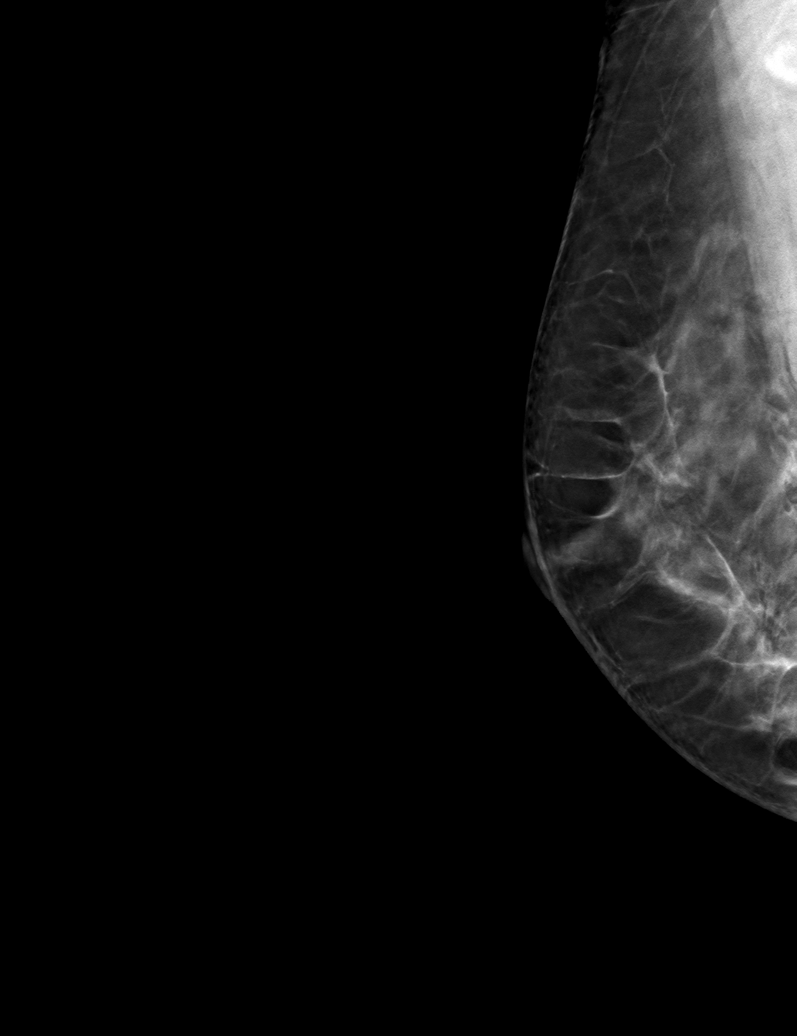

[6 of 30 positions shown; findings below may reference images not displayed]

FINDINGS: Mammographic images were obtained following MR guided biopsy of the
0.5 mm mass within the LOWER INNER RIGHT breast. The HOURGLASS
biopsy marking clip is in expected position at the site of biopsy.
IMPRESSION: Appropriate positioning of the HOURGLASS shaped biopsy marking clip
at the site of biopsy in the LOWER INNER RIGHT breast.

Final Assessment: Post Procedure Mammograms for Marker Placement

## 2021-01-10 ENCOUNTER — Other Ambulatory Visit: Payer: Self-pay | Admitting: Obstetrics and Gynecology

## 2021-01-10 DIAGNOSIS — Z9189 Other specified personal risk factors, not elsewhere classified: Secondary | ICD-10-CM

## 2022-03-20 DIAGNOSIS — Z6827 Body mass index (BMI) 27.0-27.9, adult: Secondary | ICD-10-CM | POA: Diagnosis not present

## 2022-03-20 DIAGNOSIS — Z1231 Encounter for screening mammogram for malignant neoplasm of breast: Secondary | ICD-10-CM | POA: Diagnosis not present

## 2022-03-20 DIAGNOSIS — Z01419 Encounter for gynecological examination (general) (routine) without abnormal findings: Secondary | ICD-10-CM | POA: Diagnosis not present

## 2022-03-20 DIAGNOSIS — Z13820 Encounter for screening for osteoporosis: Secondary | ICD-10-CM | POA: Diagnosis not present

## 2022-04-23 DIAGNOSIS — H02831 Dermatochalasis of right upper eyelid: Secondary | ICD-10-CM | POA: Diagnosis not present

## 2022-04-23 DIAGNOSIS — H02834 Dermatochalasis of left upper eyelid: Secondary | ICD-10-CM | POA: Diagnosis not present

## 2022-04-23 DIAGNOSIS — H01111 Allergic dermatitis of right upper eyelid: Secondary | ICD-10-CM | POA: Diagnosis not present

## 2022-04-23 DIAGNOSIS — H16201 Unspecified keratoconjunctivitis, right eye: Secondary | ICD-10-CM | POA: Diagnosis not present

## 2022-05-28 DIAGNOSIS — H6123 Impacted cerumen, bilateral: Secondary | ICD-10-CM | POA: Diagnosis not present

## 2022-05-29 DIAGNOSIS — D1801 Hemangioma of skin and subcutaneous tissue: Secondary | ICD-10-CM | POA: Diagnosis not present

## 2022-05-29 DIAGNOSIS — L821 Other seborrheic keratosis: Secondary | ICD-10-CM | POA: Diagnosis not present

## 2022-05-29 DIAGNOSIS — L720 Epidermal cyst: Secondary | ICD-10-CM | POA: Diagnosis not present

## 2022-05-29 DIAGNOSIS — L738 Other specified follicular disorders: Secondary | ICD-10-CM | POA: Diagnosis not present

## 2022-06-08 ENCOUNTER — Other Ambulatory Visit: Payer: Self-pay | Admitting: Obstetrics and Gynecology

## 2022-06-08 DIAGNOSIS — Z803 Family history of malignant neoplasm of breast: Secondary | ICD-10-CM

## 2022-09-28 ENCOUNTER — Ambulatory Visit
Admission: RE | Admit: 2022-09-28 | Discharge: 2022-09-28 | Disposition: A | Discharge status: Home / Self Care | Payer: BC Managed Care – PPO | Source: Ambulatory Visit | Attending: Obstetrics and Gynecology | Admitting: Obstetrics and Gynecology

## 2022-09-28 DIAGNOSIS — Z803 Family history of malignant neoplasm of breast: Secondary | ICD-10-CM

## 2022-09-28 DIAGNOSIS — Z1239 Encounter for other screening for malignant neoplasm of breast: Secondary | ICD-10-CM | POA: Diagnosis not present

## 2022-09-28 MED ORDER — GADOPICLENOL 0.5 MMOL/ML IV SOLN
7.0000 mL | Freq: Once | INTRAVENOUS | Status: AC | PRN
  Administered 2022-09-28: 6 mL via INTRAVENOUS

## 2022-10-08 DIAGNOSIS — N950 Postmenopausal bleeding: Secondary | ICD-10-CM | POA: Diagnosis not present

## 2023-01-02 DIAGNOSIS — N840 Polyp of corpus uteri: Secondary | ICD-10-CM | POA: Diagnosis not present

## 2023-04-22 DIAGNOSIS — H6123 Impacted cerumen, bilateral: Secondary | ICD-10-CM | POA: Diagnosis not present

## 2023-06-18 DIAGNOSIS — H16291 Other keratoconjunctivitis, right eye: Secondary | ICD-10-CM | POA: Diagnosis not present

## 2023-06-18 DIAGNOSIS — Z6828 Body mass index (BMI) 28.0-28.9, adult: Secondary | ICD-10-CM | POA: Diagnosis not present

## 2023-06-18 DIAGNOSIS — R319 Hematuria, unspecified: Secondary | ICD-10-CM | POA: Diagnosis not present

## 2023-06-18 DIAGNOSIS — Z01419 Encounter for gynecological examination (general) (routine) without abnormal findings: Secondary | ICD-10-CM | POA: Diagnosis not present

## 2023-06-18 DIAGNOSIS — H25813 Combined forms of age-related cataract, bilateral: Secondary | ICD-10-CM | POA: Diagnosis not present

## 2023-06-18 DIAGNOSIS — Z1231 Encounter for screening mammogram for malignant neoplasm of breast: Secondary | ICD-10-CM | POA: Diagnosis not present

## 2023-06-24 DIAGNOSIS — H16291 Other keratoconjunctivitis, right eye: Secondary | ICD-10-CM | POA: Diagnosis not present

## 2023-08-21 DIAGNOSIS — L821 Other seborrheic keratosis: Secondary | ICD-10-CM | POA: Diagnosis not present

## 2023-08-21 DIAGNOSIS — L918 Other hypertrophic disorders of the skin: Secondary | ICD-10-CM | POA: Diagnosis not present

## 2023-08-21 DIAGNOSIS — L738 Other specified follicular disorders: Secondary | ICD-10-CM | POA: Diagnosis not present

## 2023-08-21 DIAGNOSIS — L905 Scar conditions and fibrosis of skin: Secondary | ICD-10-CM | POA: Diagnosis not present

## 2023-09-04 DIAGNOSIS — Z131 Encounter for screening for diabetes mellitus: Secondary | ICD-10-CM | POA: Diagnosis not present

## 2023-09-04 DIAGNOSIS — R319 Hematuria, unspecified: Secondary | ICD-10-CM | POA: Diagnosis not present

## 2023-09-04 DIAGNOSIS — Z13228 Encounter for screening for other metabolic disorders: Secondary | ICD-10-CM | POA: Diagnosis not present

## 2023-09-04 DIAGNOSIS — Z1321 Encounter for screening for nutritional disorder: Secondary | ICD-10-CM | POA: Diagnosis not present

## 2023-09-04 DIAGNOSIS — Z13220 Encounter for screening for lipoid disorders: Secondary | ICD-10-CM | POA: Diagnosis not present

## 2024-01-02 DIAGNOSIS — R7303 Prediabetes: Secondary | ICD-10-CM | POA: Diagnosis not present

## 2024-01-02 DIAGNOSIS — E785 Hyperlipidemia, unspecified: Secondary | ICD-10-CM | POA: Diagnosis not present

## 2024-04-07 DIAGNOSIS — H16291 Other keratoconjunctivitis, right eye: Secondary | ICD-10-CM | POA: Diagnosis not present

## 2024-04-14 DIAGNOSIS — R102 Pelvic and perineal pain: Secondary | ICD-10-CM | POA: Diagnosis not present

## 2024-04-22 DIAGNOSIS — H16291 Other keratoconjunctivitis, right eye: Secondary | ICD-10-CM | POA: Diagnosis not present

## 2024-04-23 DIAGNOSIS — R635 Abnormal weight gain: Secondary | ICD-10-CM | POA: Diagnosis not present

## 2024-04-29 DIAGNOSIS — D485 Neoplasm of uncertain behavior of skin: Secondary | ICD-10-CM | POA: Diagnosis not present

## 2024-04-29 DIAGNOSIS — L821 Other seborrheic keratosis: Secondary | ICD-10-CM | POA: Diagnosis not present

## 2024-04-29 DIAGNOSIS — L814 Other melanin hyperpigmentation: Secondary | ICD-10-CM | POA: Diagnosis not present

## 2024-06-10 DIAGNOSIS — K5730 Diverticulosis of large intestine without perforation or abscess without bleeding: Secondary | ICD-10-CM | POA: Diagnosis not present

## 2024-06-10 DIAGNOSIS — K644 Residual hemorrhoidal skin tags: Secondary | ICD-10-CM | POA: Diagnosis not present

## 2024-06-10 DIAGNOSIS — K648 Other hemorrhoids: Secondary | ICD-10-CM | POA: Diagnosis not present

## 2024-06-10 DIAGNOSIS — Z1211 Encounter for screening for malignant neoplasm of colon: Secondary | ICD-10-CM | POA: Diagnosis not present

## 2024-06-13 DIAGNOSIS — H18821 Corneal disorder due to contact lens, right eye: Secondary | ICD-10-CM | POA: Diagnosis not present

## 2024-06-18 DIAGNOSIS — H18821 Corneal disorder due to contact lens, right eye: Secondary | ICD-10-CM | POA: Diagnosis not present

## 2024-06-18 DIAGNOSIS — E785 Hyperlipidemia, unspecified: Secondary | ICD-10-CM | POA: Diagnosis not present

## 2024-06-18 DIAGNOSIS — Z6827 Body mass index (BMI) 27.0-27.9, adult: Secondary | ICD-10-CM | POA: Diagnosis not present

## 2024-06-18 DIAGNOSIS — R7303 Prediabetes: Secondary | ICD-10-CM | POA: Diagnosis not present

## 2024-07-20 DIAGNOSIS — Z6828 Body mass index (BMI) 28.0-28.9, adult: Secondary | ICD-10-CM | POA: Diagnosis not present

## 2024-07-20 DIAGNOSIS — E669 Obesity, unspecified: Secondary | ICD-10-CM | POA: Diagnosis not present

## 2024-07-20 DIAGNOSIS — Z713 Dietary counseling and surveillance: Secondary | ICD-10-CM | POA: Diagnosis not present

## 2024-08-04 DIAGNOSIS — Z6827 Body mass index (BMI) 27.0-27.9, adult: Secondary | ICD-10-CM | POA: Diagnosis not present

## 2024-08-04 DIAGNOSIS — R7303 Prediabetes: Secondary | ICD-10-CM | POA: Diagnosis not present

## 2024-08-04 DIAGNOSIS — Z713 Dietary counseling and surveillance: Secondary | ICD-10-CM | POA: Diagnosis not present

## 2024-08-04 DIAGNOSIS — E785 Hyperlipidemia, unspecified: Secondary | ICD-10-CM | POA: Diagnosis not present

## 2024-08-18 DIAGNOSIS — Z130 Encounter for screening for diseases of the blood and blood-forming organs and certain disorders involving the immune mechanism: Secondary | ICD-10-CM | POA: Diagnosis not present

## 2024-08-18 DIAGNOSIS — Z1151 Encounter for screening for human papillomavirus (HPV): Secondary | ICD-10-CM | POA: Diagnosis not present

## 2024-08-18 DIAGNOSIS — Z6827 Body mass index (BMI) 27.0-27.9, adult: Secondary | ICD-10-CM | POA: Diagnosis not present

## 2024-08-18 DIAGNOSIS — Z01419 Encounter for gynecological examination (general) (routine) without abnormal findings: Secondary | ICD-10-CM | POA: Diagnosis not present

## 2024-08-18 DIAGNOSIS — Z124 Encounter for screening for malignant neoplasm of cervix: Secondary | ICD-10-CM | POA: Diagnosis not present

## 2024-08-19 ENCOUNTER — Other Ambulatory Visit: Payer: Self-pay | Admitting: Obstetrics and Gynecology

## 2024-08-19 DIAGNOSIS — Z803 Family history of malignant neoplasm of breast: Secondary | ICD-10-CM

## 2024-08-26 DIAGNOSIS — E669 Obesity, unspecified: Secondary | ICD-10-CM | POA: Diagnosis not present

## 2024-08-26 DIAGNOSIS — Z713 Dietary counseling and surveillance: Secondary | ICD-10-CM | POA: Diagnosis not present

## 2024-08-26 DIAGNOSIS — Z6826 Body mass index (BMI) 26.0-26.9, adult: Secondary | ICD-10-CM | POA: Diagnosis not present

## 2024-09-08 DIAGNOSIS — E785 Hyperlipidemia, unspecified: Secondary | ICD-10-CM | POA: Diagnosis not present

## 2024-09-08 DIAGNOSIS — R7303 Prediabetes: Secondary | ICD-10-CM | POA: Diagnosis not present

## 2024-09-08 DIAGNOSIS — Z6826 Body mass index (BMI) 26.0-26.9, adult: Secondary | ICD-10-CM | POA: Diagnosis not present

## 2024-09-18 DIAGNOSIS — E7800 Pure hypercholesterolemia, unspecified: Secondary | ICD-10-CM | POA: Diagnosis not present

## 2024-09-18 DIAGNOSIS — E785 Hyperlipidemia, unspecified: Secondary | ICD-10-CM | POA: Diagnosis not present

## 2024-09-18 DIAGNOSIS — R7303 Prediabetes: Secondary | ICD-10-CM | POA: Diagnosis not present

## 2024-10-28 DIAGNOSIS — Z1231 Encounter for screening mammogram for malignant neoplasm of breast: Secondary | ICD-10-CM | POA: Diagnosis not present

## 2024-12-22 ENCOUNTER — Encounter (HOSPITAL_BASED_OUTPATIENT_CLINIC_OR_DEPARTMENT_OTHER): Payer: Self-pay | Admitting: Cardiology

## 2024-12-22 ENCOUNTER — Other Ambulatory Visit (HOSPITAL_BASED_OUTPATIENT_CLINIC_OR_DEPARTMENT_OTHER): Payer: Self-pay

## 2024-12-22 ENCOUNTER — Ambulatory Visit (HOSPITAL_BASED_OUTPATIENT_CLINIC_OR_DEPARTMENT_OTHER): Admitting: Cardiology

## 2024-12-22 VITALS — BP 132/82 | HR 70 | Ht 64.0 in | Wt 161.2 lb

## 2024-12-22 DIAGNOSIS — E7841 Elevated Lipoprotein(a): Secondary | ICD-10-CM | POA: Diagnosis not present

## 2024-12-22 DIAGNOSIS — Z7689 Persons encountering health services in other specified circumstances: Secondary | ICD-10-CM

## 2024-12-22 DIAGNOSIS — Z712 Person consulting for explanation of examination or test findings: Secondary | ICD-10-CM

## 2024-12-22 DIAGNOSIS — E782 Mixed hyperlipidemia: Secondary | ICD-10-CM | POA: Diagnosis not present

## 2024-12-22 DIAGNOSIS — Z8249 Family history of ischemic heart disease and other diseases of the circulatory system: Secondary | ICD-10-CM

## 2024-12-22 DIAGNOSIS — Z7189 Other specified counseling: Secondary | ICD-10-CM | POA: Diagnosis not present

## 2024-12-22 MED ORDER — ROSUVASTATIN CALCIUM 10 MG PO TABS
10.0000 mg | ORAL_TABLET | Freq: Every day | ORAL | 3 refills | Status: AC
  Filled 2024-12-22: qty 90, 90d supply, fill #0

## 2024-12-22 MED ORDER — ASPIRIN 81 MG PO TBEC: 81.0000 mg | DELAYED_RELEASE_TABLET | Freq: Every day | ORAL | Status: AC

## 2024-12-22 NOTE — Patient Instructions (Addendum)
 Medication Instructions:  Your physician has recommended you make the following change in your medication:  1.) start aspirin  81 mg - one tablet daily 2.) start rosuvastatin  (Crestor ) 10 mg - one tablet daily  *If you need a refill on your cardiac medications before your next appointment, please call your pharmacy*  Lab Work: Return in 3 months for blood work (lipid panel and liver function)  Testing/Procedures: none  Follow-Up: At [facility], you and your health needs are our priority.  As part of our continuing mission to provide you with exceptional heart care, our providers are all part of one team.  This team includes your primary Cardiologist (physician) and Advanced Practice Providers or APPs (Physician Assistants and Nurse Practitioners) who all work together to provide you with the care you need, when you need it.  Your next appointment:   12 month(s)  Provider:   Shelda Bruckner, MD, Rosaline Bane, NP, or Reche Finder, NP

## 2024-12-22 NOTE — Progress Notes (Signed)
 " Cardiology Office Note:  .   Date:  12/22/2024  ID:  Dana Dana Weaver, DOB 15-May-1961, MRN [id number] PCP: Dana Dire, MD (Inactive)  Dana Dana Weaver Providers Cardiologist:  Dana Bruckner, MD {  History of Present Illness: Dana Dana Weaver   Dana Dana Weaver is a 64 y.o. female with PMH hyperlipidemia who is seen as a new patient consultation for dyslipidemia at the request of Dr. Zara.  Referral from 11/17/24 reviewed. Referred by Dr. Zara for evaluation of dyslipidemia. Referral notes reviewed under media tab. Lipids from 09/18/24 show Tchol 243, TG 155, HDL 54, LDL 160. Lpa significantly elevated at 215.5. Calcium  score was ordered, has not been performed.  Her husband is Dr. Bunk, gastroenterologist, present today for the visit and discussion.  She reports many years of cholesterol being up/down. Her OB/Gyn had previously discussed statin, but she has never been on this. Has been working at [employer] with Dr. Zara. She has been eating much better, exercising. Her most recent lipids were at her peak health. We reviewed her table of labs today, summarized below.  Cardiovascular risk factors: Prior clinical ASCVD: none Comorbid conditions: Endorses hyperlipidemia. Denies hypertension, diabetes, chronic kidney disease, hepatic steatosis, sleep apnea Metabolic syndrome/Obesity: Highest adult weight is about 165 lbs. Chronic inflammatory conditions: none Pregnancy history: 5 pregnancies, with 5th pregnancy had postpartum preeclampsia age 23, no gestational diabetes Tobacco use history: never Alcohol and substance use: occasional drink on Friday/Saturday nights, 1-2 drinks. Family history: father had postop stroke after valve replacement around age 67. Mother with arrhythmia, live to age 37, no CAD. She has 8 siblings. Several brothers with afib, no one with coronary disease Prior pertinent testing and/or incidental findings: lpa very elevated at 215.5.   Exercise level: doing body pump, walking, no limitations Current diet: aiming for high protein, lots of water, fruits/vegetables.  Mild snoring, not severe.  We had extensive conversation re: risk, lpa, apoB, statins, and lifestyle recommendations, see below.  ROS: Denies chest pain, shortness of breath at rest or with normal exertion. No PND, orthopnea, LE edema or unexpected weight gain. No syncope or palpitations. ROS otherwise negative except as noted.   Studies Reviewed: Dana Dana Weaver    EKG:  EKG Interpretation Date/Time:  Tuesday December 22 2024 15:17:07 EST Ventricular Rate:  68 PR Interval:  126 QRS Duration:  88 QT Interval:  390 QTC Calculation: 414 R Axis:   47  Text Interpretation: Normal sinus rhythm Normal ECG Confirmed by Dana Weaver Dana [phone]) on 12/22/2024 3:54:38 PM    Physical Exam:   VS:  BP 132/82   Pulse 70   Ht 5' 4 (1.626 m)   Wt 161 lb 3.2 oz (73.1 kg)   SpO2 98%   BMI 27.67 kg/m    Wt Readings from Last 3 Encounters:  12/22/24 161 lb 3.2 oz (73.1 kg)  04/22/17 154 lb 6.4 oz (70 kg)  12/21/16 150 lb (68 kg)    GEN: Well nourished, well developed in no acute distress HEENT: Normal, moist mucous membranes NECK: No JVD CARDIAC: regular rhythm, normal S1 and S2, no rubs or gallops. No murmur. VASCULAR: Radial and DP pulses 2+ bilaterally. No carotid bruits RESPIRATORY:  Clear to auscultation without rales, wheezing or rhonchi  ABDOMEN: Soft, non-tender, non-distended MUSCULOSKELETAL:  Ambulates independently SKIN: Warm and dry, no edema NEUROLOGIC:  Alert and oriented x 3. No focal neuro deficits noted. PSYCHIATRIC:  Normal affect    ASSESSMENT AND PLAN: .    Mixed hyperlipidemia Elevated  lipoprotein a Family history of heart disease, but not CAD CV risk evaluation -reviewed test results extensively today, see below -lpa 215.5 -apoB elevated at 133 -Tchol 243, TG 155, HDL 54, LDL 160 -discussed guidelines, recommendation, risk today. With  elevated lpa, calcium  score would not change recommendation -LDL goal would be less than 100 and ideally less than 70 -discussed pros/cons of aspirin , she will try and watch for bleeding -discussed statin, will start with rosuvastatin  10 mg daily, recheck lipids/lfts in 3 mos -will check into clinical trial for lp(a) for primary prevention to see if she qualifies  CV risk counseling and prevention -recommend heart healthy/Mediterranean diet, with whole grains, fruits, vegetable, fish, lean meats, nuts, and olive oil. Limit salt. -recommend moderate walking, 3-5 times/week for 30-50 minutes each session. Aim for at least 150 minutes/week. Goal should be pace of 3 miles/hours, or walking 1.5 miles in 30 minutes -recommend avoidance of tobacco products. Avoid excess alcohol.  Dispo: 1 year or sooner as needed  Signed, Dana Bruckner, MD   Dana Bruckner, MD, PhD, [facility]  [facility] Dana Weaver  [place]  [facility] at [facility] [address] [place], [place] [address] [phone]   "
